# Patient Record
Sex: Female | Born: 1955
Health system: Southern US, Community
[De-identification: ages and names within clinical notes are randomized; demographics above are authoritative.]

## PROBLEM LIST (undated history)

## (undated) DIAGNOSIS — K219 Gastro-esophageal reflux disease without esophagitis: Secondary | ICD-10-CM

## (undated) DIAGNOSIS — T7840XA Allergy, unspecified, initial encounter: Secondary | ICD-10-CM

## (undated) DIAGNOSIS — J45909 Unspecified asthma, uncomplicated: Secondary | ICD-10-CM

## (undated) HISTORY — DX: Allergy, unspecified, initial encounter: T78.40XA

## (undated) HISTORY — DX: Gastro-esophageal reflux disease without esophagitis: K21.9

## (undated) HISTORY — DX: Unspecified asthma, uncomplicated: J45.909

---

## 2012-12-16 DIAGNOSIS — B372 Candidiasis of skin and nail: Secondary | ICD-10-CM | POA: Insufficient documentation

## 2012-12-16 DIAGNOSIS — Z01419 Encounter for gynecological examination (general) (routine) without abnormal findings: Secondary | ICD-10-CM | POA: Insufficient documentation

## 2012-12-16 DIAGNOSIS — G43909 Migraine, unspecified, not intractable, without status migrainosus: Secondary | ICD-10-CM | POA: Insufficient documentation

## 2012-12-16 DIAGNOSIS — I7300 Raynaud's syndrome without gangrene: Secondary | ICD-10-CM | POA: Insufficient documentation

## 2012-12-16 DIAGNOSIS — IMO0001 Reserved for inherently not codable concepts without codable children: Secondary | ICD-10-CM | POA: Insufficient documentation

## 2012-12-16 DIAGNOSIS — K219 Gastro-esophageal reflux disease without esophagitis: Secondary | ICD-10-CM | POA: Insufficient documentation

## 2014-12-09 DIAGNOSIS — Z9189 Other specified personal risk factors, not elsewhere classified: Secondary | ICD-10-CM | POA: Insufficient documentation

## 2014-12-20 ENCOUNTER — Other Ambulatory Visit: Payer: Self-pay | Admitting: Nurse Practitioner

## 2014-12-20 DIAGNOSIS — R928 Other abnormal and inconclusive findings on diagnostic imaging of breast: Secondary | ICD-10-CM

## 2014-12-21 ENCOUNTER — Other Ambulatory Visit: Payer: Self-pay

## 2014-12-21 ENCOUNTER — Other Ambulatory Visit: Payer: Self-pay | Admitting: Nurse Practitioner

## 2014-12-21 DIAGNOSIS — R928 Other abnormal and inconclusive findings on diagnostic imaging of breast: Secondary | ICD-10-CM

## 2014-12-26 ENCOUNTER — Ambulatory Visit
Admission: RE | Admit: 2014-12-26 | Discharge: 2014-12-26 | Disposition: A | Discharge status: Home / Self Care | Payer: BLUE CROSS/BLUE SHIELD | Source: Ambulatory Visit | Attending: Nurse Practitioner | Admitting: Nurse Practitioner

## 2014-12-26 DIAGNOSIS — R928 Other abnormal and inconclusive findings on diagnostic imaging of breast: Secondary | ICD-10-CM

## 2016-03-01 ENCOUNTER — Other Ambulatory Visit: Payer: Self-pay | Admitting: *Deleted

## 2016-03-01 DIAGNOSIS — Z1231 Encounter for screening mammogram for malignant neoplasm of breast: Secondary | ICD-10-CM

## 2016-03-15 ENCOUNTER — Ambulatory Visit
Admission: RE | Admit: 2016-03-15 | Discharge: 2016-03-15 | Disposition: A | Discharge status: Home / Self Care | Payer: BLUE CROSS/BLUE SHIELD | Source: Ambulatory Visit | Attending: *Deleted | Admitting: *Deleted

## 2016-03-15 DIAGNOSIS — Z1231 Encounter for screening mammogram for malignant neoplasm of breast: Secondary | ICD-10-CM

## 2017-12-19 ENCOUNTER — Other Ambulatory Visit: Payer: Self-pay | Admitting: *Deleted

## 2017-12-19 DIAGNOSIS — Z1231 Encounter for screening mammogram for malignant neoplasm of breast: Secondary | ICD-10-CM

## 2018-01-09 ENCOUNTER — Ambulatory Visit
Admission: RE | Admit: 2018-01-09 | Discharge: 2018-01-09 | Disposition: A | Discharge status: Home / Self Care | Payer: 59 | Source: Ambulatory Visit | Attending: *Deleted | Admitting: *Deleted

## 2018-01-09 DIAGNOSIS — Z1231 Encounter for screening mammogram for malignant neoplasm of breast: Secondary | ICD-10-CM

## 2019-03-18 ENCOUNTER — Other Ambulatory Visit: Payer: Self-pay | Admitting: *Deleted

## 2019-03-18 ENCOUNTER — Ambulatory Visit
Admission: RE | Admit: 2019-03-18 | Discharge: 2019-03-18 | Disposition: A | Discharge status: Home / Self Care | Payer: 59 | Source: Ambulatory Visit | Attending: *Deleted | Admitting: *Deleted

## 2019-03-18 ENCOUNTER — Other Ambulatory Visit: Payer: Self-pay

## 2019-03-18 DIAGNOSIS — Z1231 Encounter for screening mammogram for malignant neoplasm of breast: Secondary | ICD-10-CM

## 2019-08-28 IMAGING — MG DIGITAL SCREENING BILATERAL MAMMOGRAM WITH TOMO AND CAD
8 series · 8 of 24 positions shown · non-contrast
Comparison: Previous exam(s).

CLINICAL DATA: Screening.

EXAM:
DIGITAL SCREENING BILATERAL MAMMOGRAM WITH TOMO AND CAD

[R CC synth-2D]
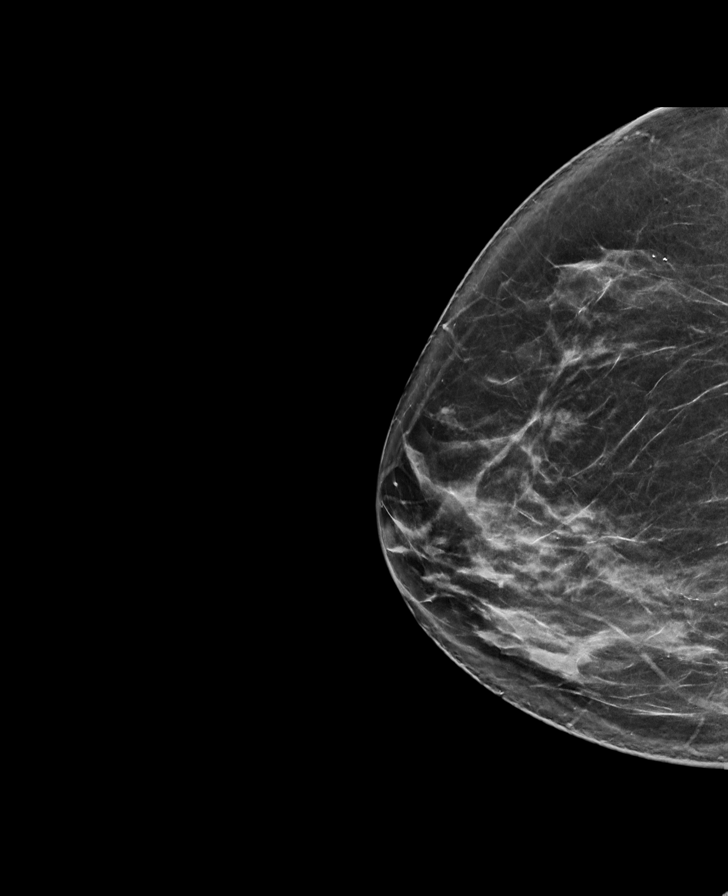

[L MLO synth-2D]
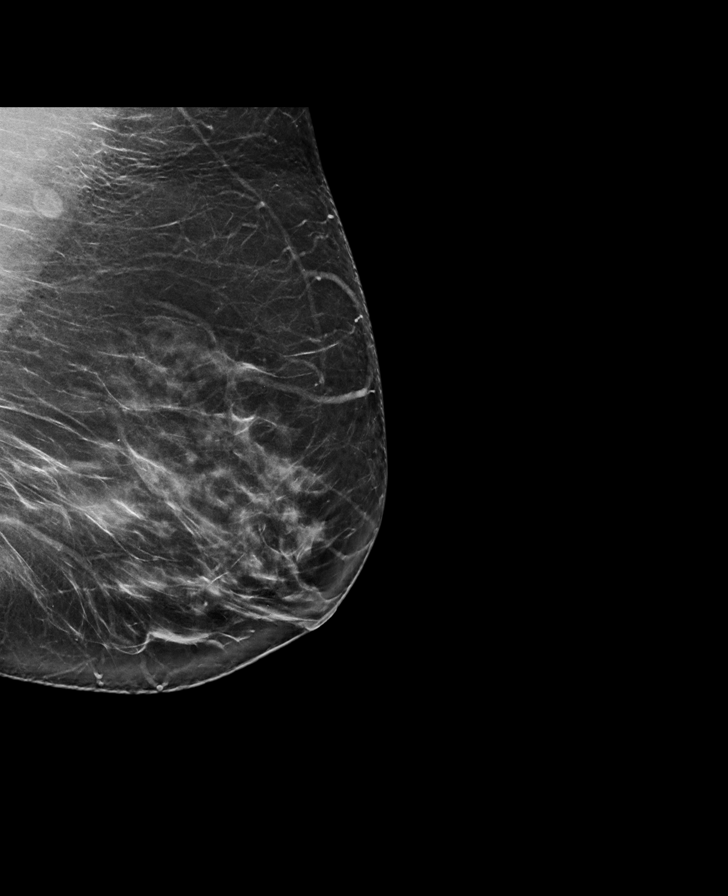

[L CC synth-2D]
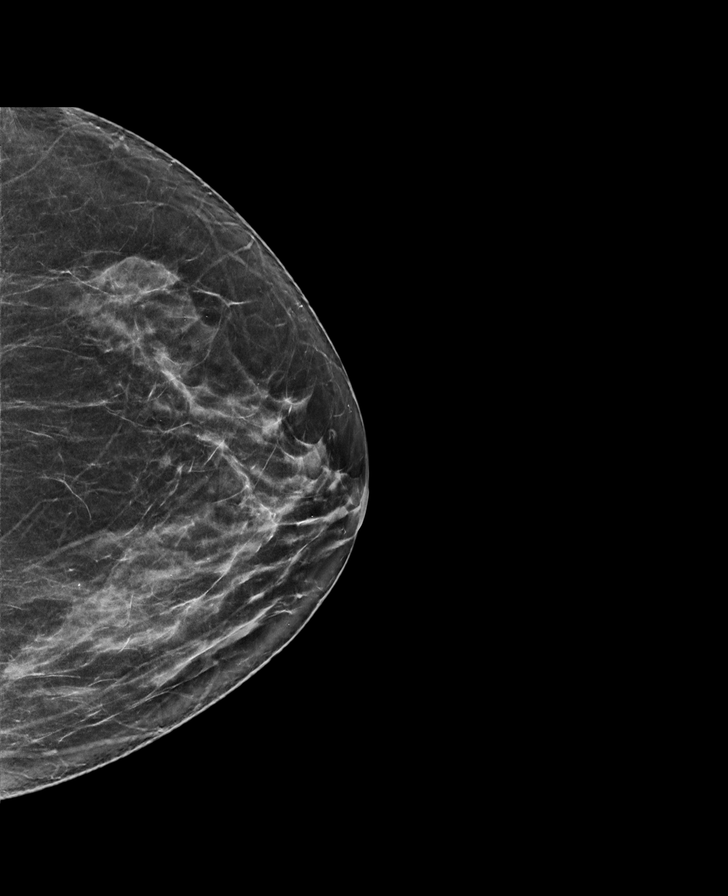

[R MLO synth-2D]
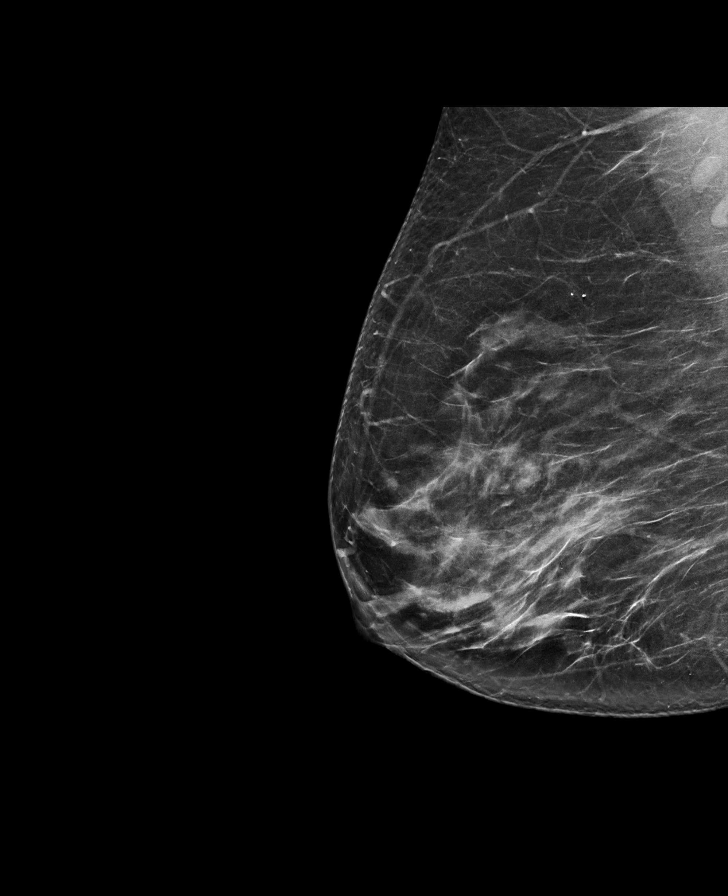

[L CC tomo · tomo slice 35/69.0]
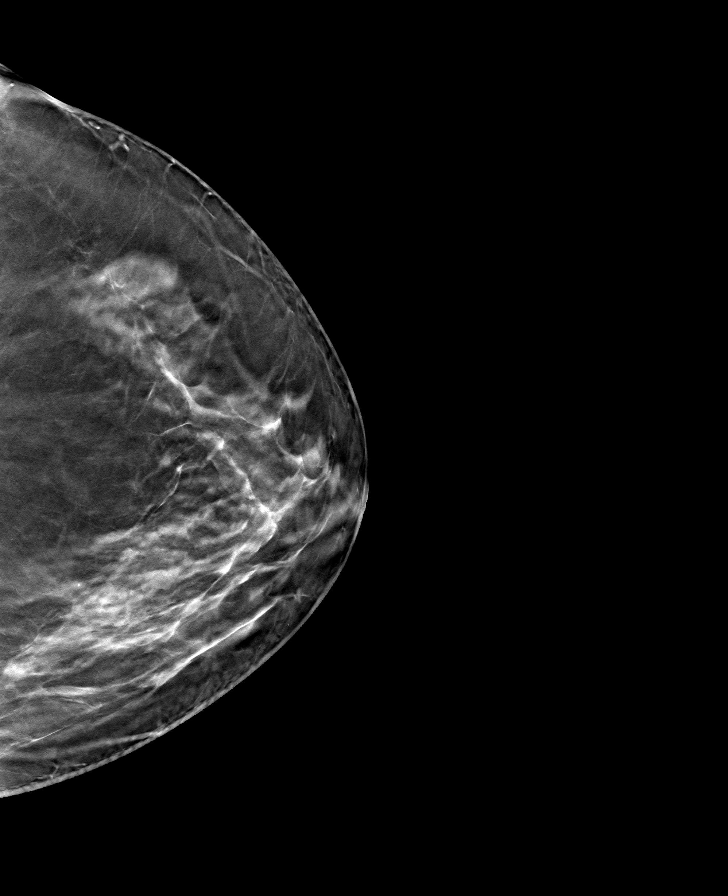

[R MLO tomo · tomo slice 39/77.0]
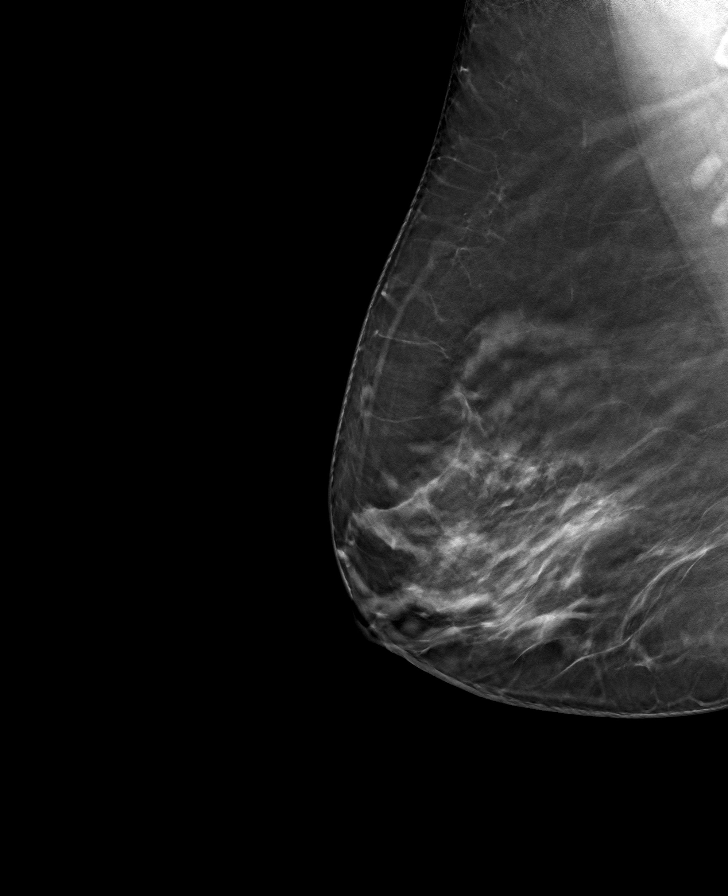

[L MLO tomo · tomo slice 41/80.0]
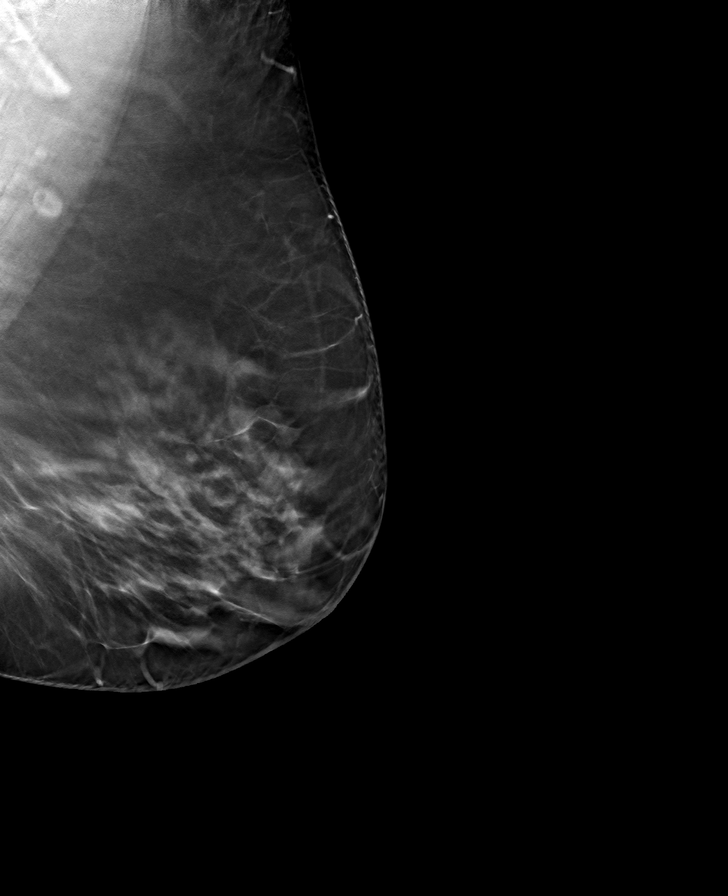

[R CC tomo · tomo slice 37/74.0]
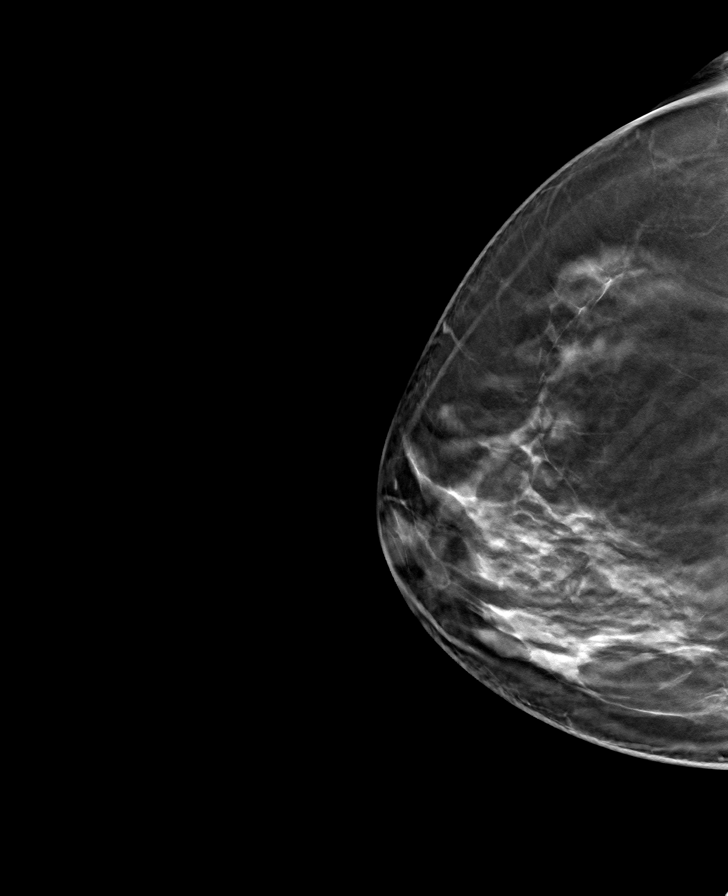

[8 of 24 positions shown; findings below may reference images not displayed]

ACR Breast Density Category c: The breast tissue is heterogeneously
dense, which may obscure small masses.
FINDINGS: There are no findings suspicious for malignancy. Images were
processed with CAD.
IMPRESSION: No mammographic evidence of malignancy. A result letter of this
screening mammogram will be mailed directly to the patient.

RECOMMENDATION:
Screening mammogram in one year. (Code:[id number])

BI-RADS CATEGORY  1: Negative.

## 2020-05-25 ENCOUNTER — Other Ambulatory Visit: Payer: Self-pay

## 2020-05-25 ENCOUNTER — Other Ambulatory Visit: Payer: Self-pay | Admitting: *Deleted

## 2020-05-25 ENCOUNTER — Ambulatory Visit
Admission: RE | Admit: 2020-05-25 | Discharge: 2020-05-25 | Disposition: A | Discharge status: Home / Self Care | Payer: 59 | Source: Ambulatory Visit | Attending: *Deleted | Admitting: *Deleted

## 2020-05-25 DIAGNOSIS — Z1231 Encounter for screening mammogram for malignant neoplasm of breast: Secondary | ICD-10-CM

## 2020-07-02 LAB — CBC AND DIFFERENTIAL
HCT: 46 (ref 36–46)
Hemoglobin: 14.6 (ref 12.0–16.0)
Platelets: 279 (ref 150–399)
WBC: 10.6

## 2020-07-02 LAB — BASIC METABOLIC PANEL
BUN: 13 (ref 4–21)
CO2: 25 — AB (ref 13–22)
Chloride: 102 (ref 99–108)
Creatinine: 0.9 (ref <=1.1)
Glucose: 290
Potassium: 4.6 (ref 3.4–5.3)
Sodium: 139 (ref 137–147)

## 2020-07-02 LAB — HEPATIC FUNCTION PANEL
ALT: 25 (ref 7–35)
AST: 34 (ref 13–35)
Alkaline Phosphatase: 54 (ref 25–125)
Bilirubin, Total: 0.4

## 2020-07-02 LAB — COMPREHENSIVE METABOLIC PANEL
Albumin: 4.5 (ref 3.5–5.0)
Calcium: 8.7 (ref 8.7–10.7)

## 2020-07-02 LAB — POCT INR: INR: 1 (ref <=1.1)

## 2020-07-02 LAB — HEMOGLOBIN A1C: Hemoglobin A1C: 5.7

## 2020-07-03 DIAGNOSIS — J4522 Mild intermittent asthma with status asthmaticus: Secondary | ICD-10-CM | POA: Insufficient documentation

## 2020-07-05 ENCOUNTER — Ambulatory Visit: Payer: 59 | Admitting: Family Medicine

## 2020-07-13 ENCOUNTER — Ambulatory Visit: Payer: 59 | Admitting: Family Medicine

## 2020-07-13 ENCOUNTER — Encounter: Payer: Self-pay | Admitting: Family Medicine

## 2020-07-13 ENCOUNTER — Other Ambulatory Visit: Payer: Self-pay

## 2020-07-13 VITALS — BP 128/72 | HR 84 | Temp 97.2°F | Ht 61.0 in | Wt 228.0 lb

## 2020-07-13 DIAGNOSIS — R0603 Acute respiratory distress: Secondary | ICD-10-CM

## 2020-07-13 DIAGNOSIS — K219 Gastro-esophageal reflux disease without esophagitis: Secondary | ICD-10-CM

## 2020-07-13 MED ORDER — ALBUTEROL SULFATE HFA 108 (90 BASE) MCG/ACT IN AERS: 2.0000 | INHALATION_SPRAY | Freq: Four times a day (QID) | RESPIRATORY_TRACT | 5 refills | Status: AC | PRN

## 2020-07-13 NOTE — Progress Notes (Signed)
New Patient Office Visit  Subjective:  Patient ID: Kiara Bailey, female    DOB: 11/27/1955  Age: 64 y.o. MRN: [id number]  IO:[id number] care  HPI Kiara Bailey presents to establish care-Family Physician-Hospice care locally  Pt [place]  [place]. Pt states it was cold and rainy. This house where they were staying had a cat that had been there at some point that caused her to have-itchy eyes. Pt stated she had been using her inhaler several times a day due to wheezing caused by the weather change. Pt stated she did not use steroid inhalers as she felt they caused weight gain. Pt had not received a steroid injection recently nor had she been seen in an ER/UC. Pt states she used albuterol nebulizer and MDI for intermittent asthma. When pt came back from the beach a mess had occurred in her house. She requested that the  wet/dry vac be used to clean up  the mess. Unfortunately the vac had been outside and the filter had not been recently changed. The pt immediately noted the smell of stink bugs when the unit was turned on and she started to feel tightness in her chest. The pt states she rapidly could not move air into her lungs. Pt used her albuterol MDI-15 times with no improvement-911 was  called for immediate evaluation. Upon arrival by EMS at her home EMS personal  reported she was cyanotic and oxygen sat 40%. Pt remembers her family saying they(EMS) are here and she remembers nothing else until waking up in ER. EMS inserted NPA and bagged the patient improving her oxygen level to 98%.  Pt was given 0.5mg  epi and 125 Solumedrol on the way to the ER. In the ER pt was given  Magnesium 2grams, 1 L NS. Pt completed an ecg and labwork with the first hour of resuscitation marked improvement noted in her airway. No hypotension, hives, throat or tongue swelling  Pt sat at 89% on 6L [place]. Pt was placed on 15L [place] and stats improved to 93%. BP  182/134 on arrival with a HR123-improved to BP 111/74, pulse 85. WBC 10.9/H/H 14.6/45/8 D DIMER QUANT 40 NA 139 POT 4.6, GLC 290, HGBa1c 5.7%  Toponin .034 COVID negative, CXR heart size nl, lungs clear CTA-no defects, mild cardiomegaly, no pericardial effusion or thickening.   PMH-asthma/GERD/migraines/pneumonia yearly over the past several years/Raynaud's disease/shingles 2010 PSH-GYN cryo, T and A 1963  Pt was concerned at one time she had fibromyalgia-drinks reliv twice a week and feels better physicially  Divorced-lives with significant other for 10 years-2 children, 2 grandchildren Works as a Development worker, community for [employer] tob use No regular alcohol use Social History   Socioeconomic History  . Marital status: Married    Spouse name: Not on file  . Number of children: Not on file  . Years of education: Not on file  . Highest education level: Not on file  Occupational History  . Not on file  Tobacco Use  . Smoking status: Not on file  Substance and Sexual Activity  . Alcohol use: Not on file  . Drug use: Not on file  . Sexual activity: Not on file  Other Topics Concern  . Not on file  Social History Narrative  . Not on file   Social Determinants of Health   Financial Resource Strain:   . Difficulty of Paying Living Expenses: Not on file  Food Insecurity:   . Worried  About Running Out of Food in the Last Year: Not on file  . Ran Out of Food in the Last Year: Not on file  Transportation Needs:   . Lack of Transportation (Medical): Not on file  . Lack of Transportation (Non-Medical): Not on file  Physical Activity:   . Days of Exercise per Week: Not on file  . Minutes of Exercise per Session: Not on file  Stress:   . Feeling of Stress : Not on file  Social Connections:   . Frequency of Communication with Friends and Family: Not on file  . Frequency of Social Gatherings with Friends and Family: Not on file  . Attends Religious Services: Not on file  . Active Member of  Clubs or Organizations: Not on file  . Attends Banker Meetings: Not on file  . Marital Status: Not on file  Intimate Partner Violence:   . Fear of Current or Ex-Partner: Not on file  . Emotionally Abused: Not on file  . Physically Abused: Not on file  . Sexually Abused: Not on file    ROS Review of Systems  Constitutional: Positive for fatigue. Negative for fever.  HENT: Positive for postnasal drip. Negative for ear pain, sinus pressure, sinus pain, sore throat, tinnitus, trouble swallowing and voice change.   Eyes:       Glasses  Respiratory:       Chest tightness-resolved SOB-resolved Cough-resolved  Cardiovascular: Positive for leg swelling.  Gastrointestinal:       GERD  Genitourinary:       Stress incontinence-cough/laugh  Musculoskeletal: Negative.   Skin: Negative.   Allergic/Immunologic: Positive for environmental allergies.  Neurological: Negative for dizziness, speech difficulty, weakness and headaches.  Hematological: Negative.   Psychiatric/Behavioral:       Depression -living situation, pt has a daughter who she assists in caring for her child. 7yo little boy(grandchild)-keeps him at night and takes him to school in the morning. Daughter is not working as she also has a Personnel officer. Pt anxious about recent  event.    Objective:   Today's Vitals: BP 128/72 (BP Location: Left Arm, Patient Position: Sitting, Cuff Size: Large)   Pulse 84   Temp (!) 97.2 F (36.2 C) (Temporal)   Ht 5\' 1"  (1.549 m)   Wt 228 lb (103.4 kg)   SpO2 98%   BMI 43.08 kg/m   Physical Exam Constitutional:      Appearance: Normal appearance.  HENT:     Head: Normocephalic and atraumatic.     Nose: Nose normal.     Mouth/Throat:     Mouth: Mucous membranes are moist.  Eyes:     Conjunctiva/sclera: Conjunctivae normal.  Cardiovascular:     Rate and Rhythm: Normal rate and regular rhythm.     Pulses: Normal pulses.     Heart sounds: Normal heart sounds.  Pulmonary:      Effort: Pulmonary effort is normal.     Breath sounds: Normal breath sounds.  Musculoskeletal:     Cervical back: Normal range of motion and neck supple.     Right lower leg: Edema present.     Left lower leg: Edema present.  Neurological:     General: No focal deficit present.     Mental Status: She is alert and oriented to person, place, and time.  Psychiatric:        Mood and Affect: Mood normal.        Behavior: Behavior normal.     Assessment &  Plan:  1. Respiratory distress Allergy testing-d/w pt importance of identifying triggers. No prior admission for respiratory distress.  Epi Albuterol solumedrol 2. Gastroesophageal reflux disease, unspecified whether esophagitis present No current concerns-Nexium  Outpatient Encounter Medications as of 07/13/2020  Medication Sig  . albuterol (PROVENTIL) (2.5 MG/3ML) 0.083% nebulizer solution Take by nebulization.  Marland Kitchen albuterol (VENTOLIN HFA) 108 (90 Base) MCG/ACT inhaler Inhale into the lungs.  Marland Kitchen aspirin-acetaminophen-caffeine (EXCEDRIN MIGRAINE) 250-250-65 MG tablet Take by mouth.  . EPINEPHrine 0.3 mg/0.3 mL IJ SOAJ injection Inject once if have throat swelling, severe difficulty breathing. If you use an Epi pen you must call EMS or go to the hospital immediately.  . [DISCONTINUED] albuterol (PROVENTIL) (2.5 MG/3ML) 0.083% nebulizer solution Inhale into the lungs.   No facility-administered encounter medications on file as of 07/13/2020.  45 min spent with patient-obtaining history, reviewing records from ER and hospitalization. Discussion about assessment and plan-pt agreed to allergy assessment  Follow-up: Allergist to identify trigger-life threatening reaction Dorette Grate, MD

## 2020-07-13 NOTE — Patient Instructions (Signed)
Asthma, Adult  Asthma is a long-term (chronic) condition in which the airways get tight and narrow. The airways are the breathing passages that lead from the nose and mouth down into the lungs. A person with asthma will have times when symptoms get worse. These are called asthma attacks. They can cause coughing, whistling sounds when you breathe (wheezing), shortness of breath, and chest pain. They can make it hard to breathe. There is no cure for asthma, but medicines and lifestyle changes can help control it. There are many things that can bring on an asthma attack or make asthma symptoms worse (triggers). Common triggers include:  Mold.  Dust.  Cigarette smoke.  Cockroaches.  Things that can cause allergy symptoms (allergens). These include animal skin flakes (dander) and pollen from trees or grass.  Things that pollute the air. These may include household cleaners, wood smoke, smog, or chemical odors.  Cold air, weather changes, and wind.  Crying or laughing hard.  Stress.  Certain medicines or drugs.  Certain foods such as dried fruit, potato chips, and grape juice.  Infections, such as a cold or the flu.  Certain medical conditions or diseases.  Exercise or tiring activities. Asthma may be treated with medicines and by staying away from the things that cause asthma attacks. Types of medicines may include:  Controller medicines. These help prevent asthma symptoms. They are usually taken every day.  Fast-acting reliever or rescue medicines. These quickly relieve asthma symptoms. They are used as needed and provide short-term relief.  Allergy medicines if your attacks are brought on by allergens.  Medicines to help control the body's defense (immune) system. Follow these instructions at home: Avoiding triggers in your home  Change your heating and air conditioning filter often.  Limit your use of fireplaces and wood stoves.  Get rid of pests (such as roaches and  mice) and their droppings.  Throw away plants if you see mold on them.  Clean your floors. Dust regularly. Use cleaning products that do not smell.  Have someone vacuum when you are not home. Use a vacuum cleaner with a HEPA filter if possible.  Replace carpet with wood, tile, or vinyl flooring. Carpet can trap animal skin flakes and dust.  Use allergy-proof pillows, mattress covers, and box spring covers.  Wash bed sheets and blankets every week in hot water. Dry them in a dryer.  Keep your bedroom free of any triggers.  Avoid pets and keep windows closed when things that cause allergy symptoms are in the air.  Use blankets that are made of polyester or cotton.  Clean bathrooms and kitchens with bleach. If possible, have someone repaint the walls in these rooms with mold-resistant paint. Keep out of the rooms that are being cleaned and painted.  Wash your hands often with soap and water. If soap and water are not available, use hand sanitizer.  Do not allow anyone to smoke in your home. General instructions  Take over-the-counter and prescription medicines only as told by your doctor. ? Talk with your doctor if you have questions about how or when to take your medicines. ? Make note if you need to use your medicines more often than usual.  Do not use any products that contain nicotine or tobacco, such as cigarettes and e-cigarettes. If you need help quitting, ask your doctor.  Stay away from secondhand smoke.  Avoid doing things outdoors when allergen counts are high and when air quality is low.  Wear a ski mask   when doing outdoor activities in the winter. The mask should cover your nose and mouth. Exercise indoors on cold days if you can.  Warm up before you exercise. Take time to cool down after exercise.  Use a peak flow meter as told by your doctor. A peak flow meter is a tool that measures how well the lungs are working.  Keep track of the peak flow meter's readings.  Write them down.  Follow your asthma action plan. This is a written plan for taking care of your asthma and treating your attacks.  Make sure you get all the shots (vaccines) that your doctor recommends. Ask your doctor about a flu shot and a pneumonia shot.  Keep all follow-up visits as told by your doctor. This is important. Contact a doctor if:  You have wheezing, shortness of breath, or a cough even while taking medicine to prevent attacks.  The mucus you cough up (sputum) is thicker than usual.  The mucus you cough up changes from clear or white to yellow, green, gray, or bloody.  You have problems from the medicine you are taking, such as: ? A rash. ? Itching. ? Swelling. ? Trouble breathing.  You need reliever medicines more than 2-3 times a week.  Your peak flow reading is still at 50-79% of your personal best after following the action plan for 1 hour.  You have a fever. Get help right away if:  You seem to be worse and are not responding to medicine during an asthma attack.  You are short of breath even at rest.  You get short of breath when doing very little activity.  You have trouble eating, drinking, or talking.  You have chest pain or tightness.  You have a fast heartbeat.  Your lips or fingernails start to turn blue.  You are light-headed or dizzy, or you faint.  Your peak flow is less than 50% of your personal best.  You feel too tired to breathe normally. Summary  Asthma is a long-term (chronic) condition in which the airways get tight and narrow. An asthma attack can make it hard to breathe.  Asthma cannot be cured, but medicines and lifestyle changes can help control it.  Make sure you understand how to avoid triggers and how and when to use your medicines. This information is not intended to replace advice given to you by your health care provider. Make sure you discuss any questions you have with your health care provider. Document Revised:  10/15/2018 Document Reviewed: 09/16/2016 Elsevier Patient Education  2020 [employer] Inc.  

## 2020-07-24 ENCOUNTER — Encounter: Payer: Self-pay | Admitting: Family Medicine

## 2020-07-24 NOTE — Progress Notes (Signed)
TO WHOM IT MAY CONCERN:  Dr. Dan Humphreys was seen at [facility] for a hospital follow up from an anaphylactic reaction requiring EMS transport for Emergency evaluation.  The patient was hospitalized overnight for additional observation.  The patient has an allergy appointment scheduled for August 31 2020 to further assess the allergens that triggered the event. Please allow the patient to continue telecommuting until the allergy evaluation has been completed.  Thank you for your attention in this matter.   Sincerely, Dorette Grate, MD

## 2020-08-31 ENCOUNTER — Ambulatory Visit: Payer: 59 | Admitting: Allergy and Immunology

## 2020-11-24 ENCOUNTER — Other Ambulatory Visit (HOSPITAL_COMMUNITY): Payer: Self-pay | Admitting: *Deleted

## 2020-12-04 ENCOUNTER — Other Ambulatory Visit: Payer: Self-pay

## 2020-12-04 ENCOUNTER — Ambulatory Visit (HOSPITAL_BASED_OUTPATIENT_CLINIC_OR_DEPARTMENT_OTHER)
Admission: RE | Admit: 2020-12-04 | Discharge: 2020-12-04 | Disposition: A | Discharge status: Home / Self Care | Payer: 59 | Source: Ambulatory Visit | Attending: Cardiology | Admitting: Cardiology

## 2020-12-16 ENCOUNTER — Other Ambulatory Visit: Payer: Self-pay | Admitting: Family Medicine

## 2022-07-23 IMAGING — CT CT CARDIAC CORONARY ARTERY CALCIUM SCORE
2 series · 14 of 20 positions shown, 16 images · non-contrast
Comparison: None.
COMPARISON: None.

Addendum:
EXAM:
OVER-READ INTERPRETATION  CT CHEST

The following report is an over-read performed by radiologist Dr.
Aurea Tho [REDACTED] on 12/04/2020. This
over-read does not include interpretation of cardiac or coronary
anatomy or pathology. The coronary calcium score interpretation by
the cardiologist is attached.
CLINICAL DATA: Cardiovascular Disease Risk stratification
Coronary Calcium Score
TECHNIQUE: A gated, non-contrast computed tomography scan of the heart was
performed using 3mm slice thickness. Axial images were analyzed on a
dedicated workstation. Calcium scoring of the coronary arteries was
performed using the Agatston method.

[Series 2: casc 3.0 i36f 2 (id) · axial · 0.40mm/px · z∈[-232,-127]mm · 8 of 47 slices shown, 10 images]
[im 6/47  vessel]
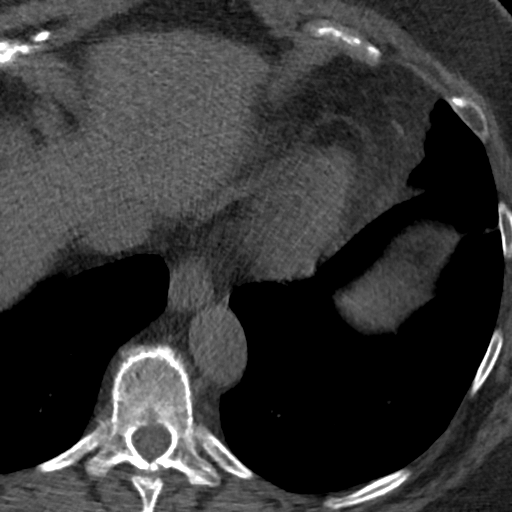
[im 6/47  lung]
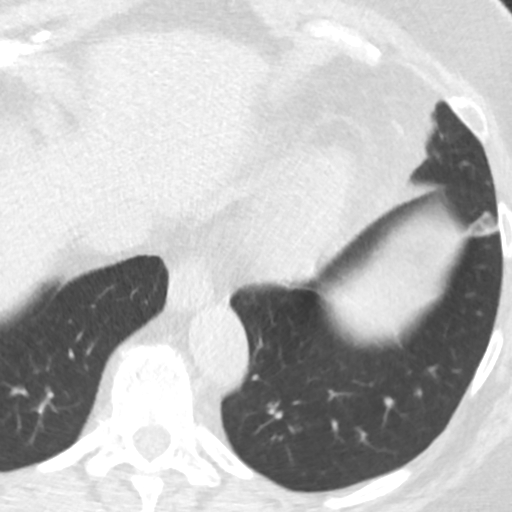
[im 11/47  vessel]
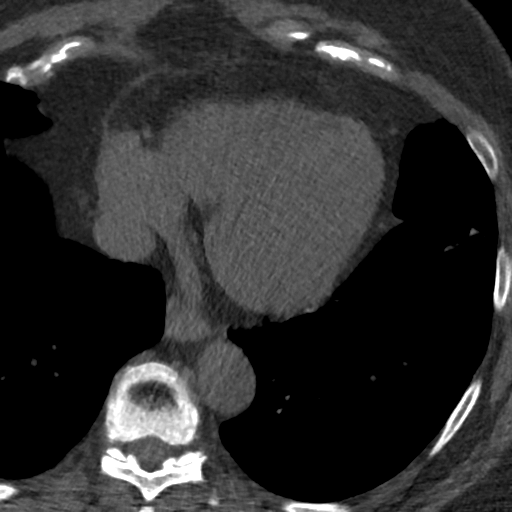
[im 16/47  vessel]
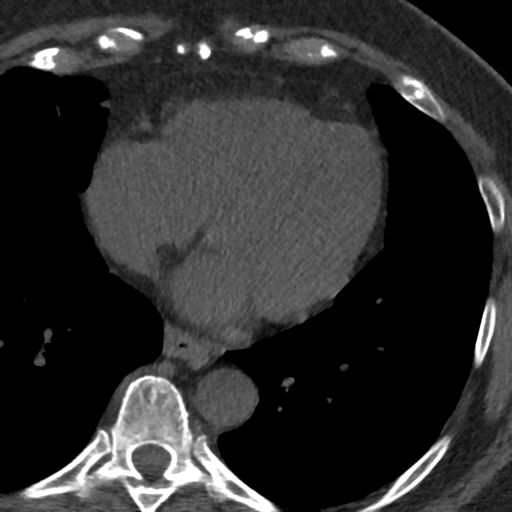
[im 21/47  vessel]
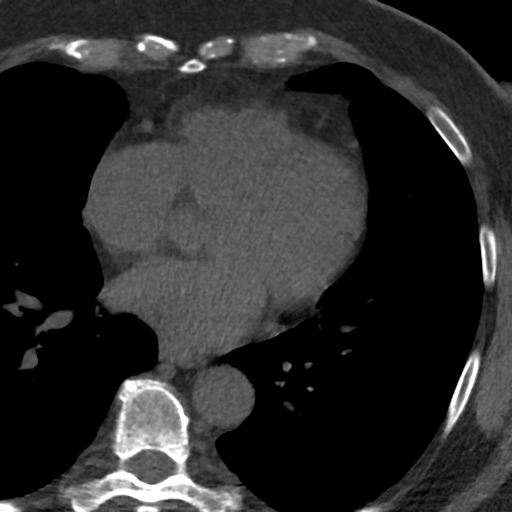
[im 26/47  vessel]
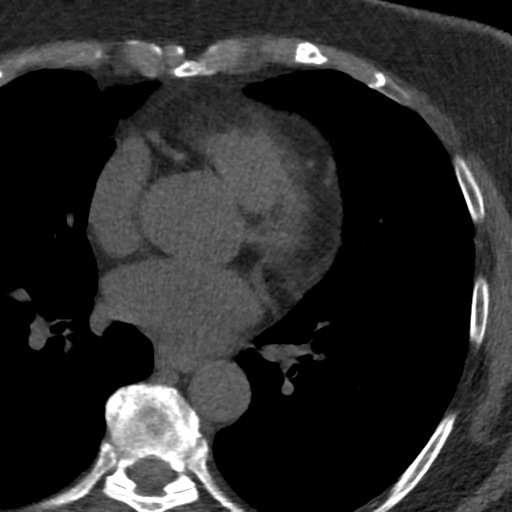
[im 26/47  lung]
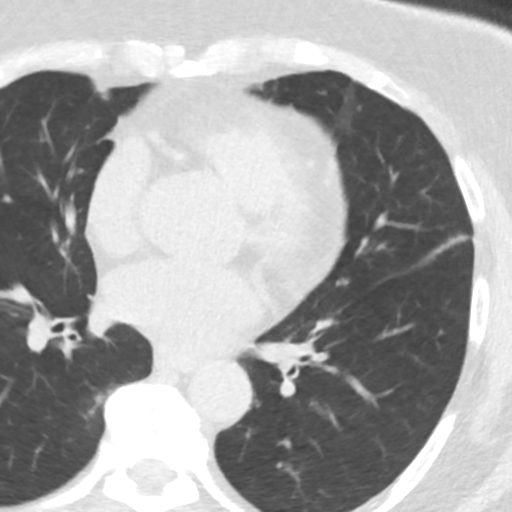
[im 31/47  vessel]
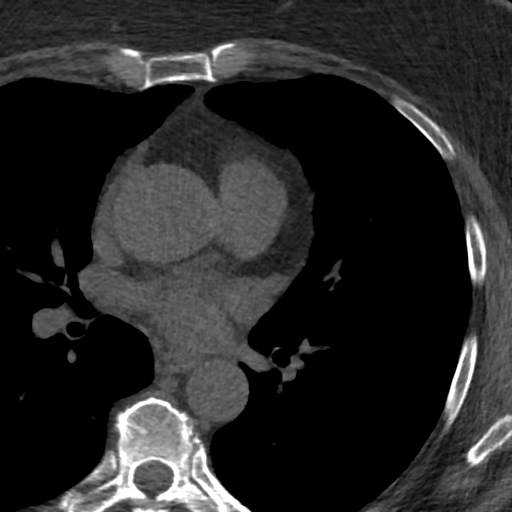
[im 36/47  vessel]
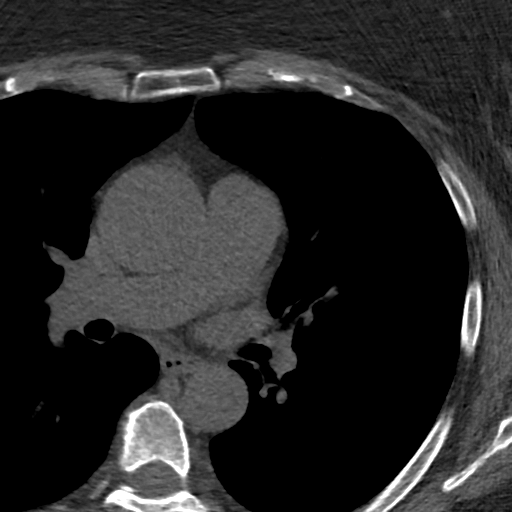
[im 41/47  vessel]
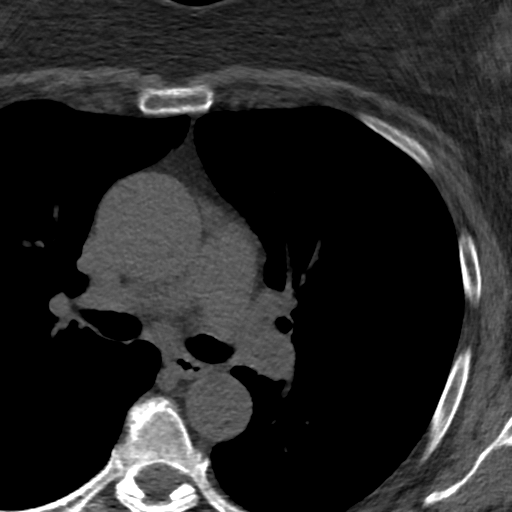

[Series 4: lung st · axial · 0.73mm/px · z∈[-232,-157]mm · 6 of 47 slices shown]
[im 6/47  lung]
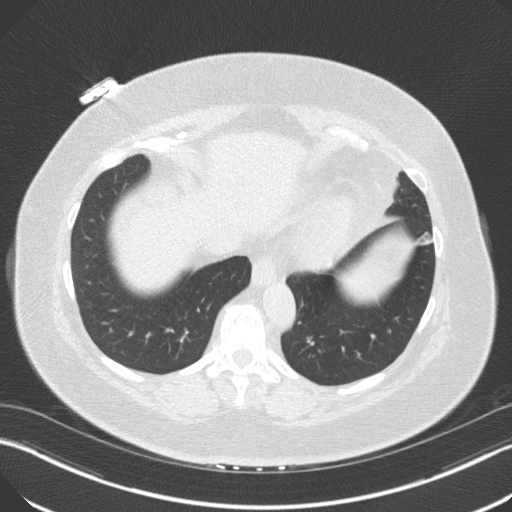
[im 11/47  lung]
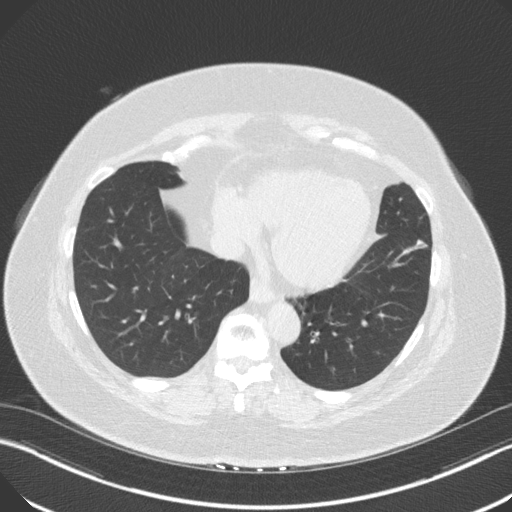
[im 16/47  lung]
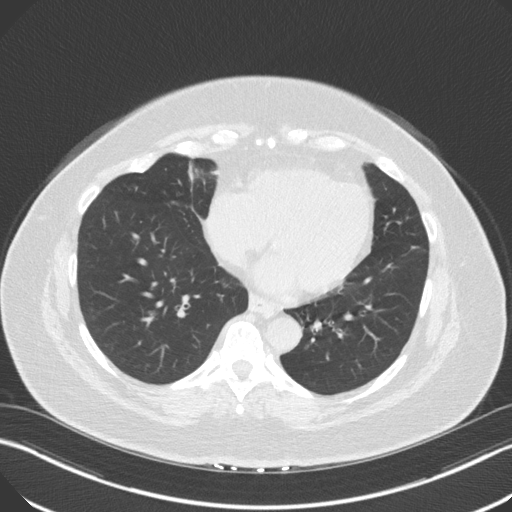
[im 21/47  lung]
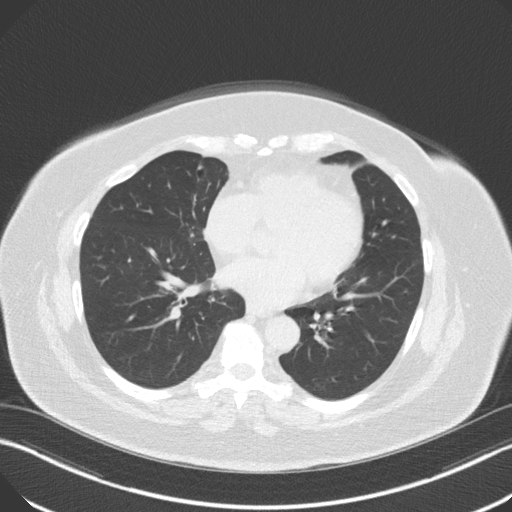
[im 26/47  lung]
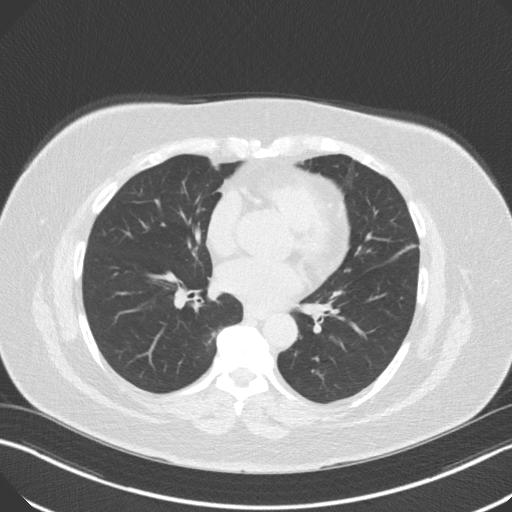
[im 31/47  lung]
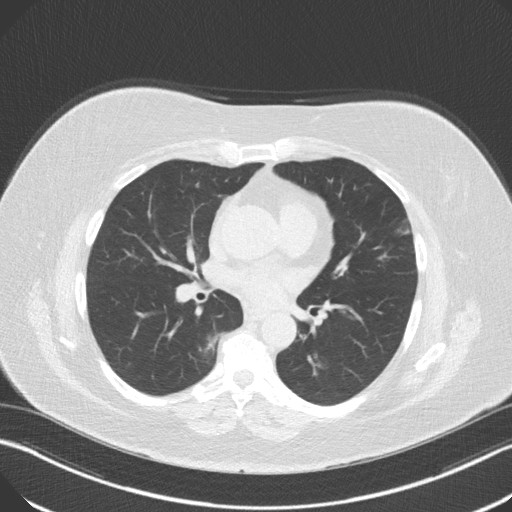

[14 of 20 positions shown; findings below may reference images not displayed]

FINDINGS: Aneurysmal dilatation of the ascending thoracic aorta (4.5 cm in
diameter). Mild scarring in the lung bases bilaterally. Within the
visualized portions of the thorax there are no suspicious appearing
pulmonary nodules or masses, there is no acute consolidative
airspace disease, no pleural effusions, no pneumothorax and no
lymphadenopathy. Visualized portions of the upper abdomen are
unremarkable. There are no aggressive appearing lytic or blastic
lesions noted in the visualized portions of the skeleton.
IMPRESSION: 1. Mild aneurysmal dilatation of the ascending thoracic aorta (4.5
cm in diameter). Ascending thoracic aortic aneurysm. Recommend
semi-annual imaging followup by CTA or MRA and referral to
cardiothoracic surgery if not already obtained. This recommendation
follows 7383 ACCF/AHA/AATS/ACR/ASA/SCA/EX/DHARNI/JUMPER/AZALAI Guidelines
for the Diagnosis and Management of Patients With Thoracic Aortic
Disease. Circulation. 7383; 121: E266-e369. Aortic aneurysm NOS
(AYOJI-YES.3).
FINDINGS: Coronary Calcium Score:

Left main: 0

Left anterior descending artery: 0

Left circumflex artery: 0

Right coronary artery: 0

Total: 0

Percentile: NA

Pericardium: Normal.

Non-cardiac: See separate report from [REDACTED].
IMPRESSION: Coronary calcium score of 0.



If CAC=0, it is reasonable to withhold statin therapy and reassess
in 5 to 10 years, as long as higher risk conditions are absent
(diabetes mellitus, family history of premature CHD in first degree
relatives (males <55 years; females <65 years), cigarette smoking,
or LDL >=190 mg/dL).

If CAC is 1 to 99, it is reasonable to initiate statin therapy for
patients >=55 years of age.

If CAC is >=100 or >=75th percentile, it is reasonable to initiate
statin therapy at any age.

Cardiology referral should be considered for patients with CAC
scores >=400 or >=75th percentile.

*8428 AHA/ACC/AACVPR/AAPA/ABC/TREPEE/ULRICH/SIMPSON/Jens/MOATSHE/KOTOV/RAULLU
Guideline on the Management of Blood Cholesterol: A Report of the
American College of Cardiology/American Heart Association Task Force
on Clinical Practice Guidelines. J Am Coll Cardiol.
0888;73(24):5295-5957.

*** End of Addendum ***
EXAM:
OVER-READ INTERPRETATION  CT CHEST

The following report is an over-read performed by radiologist Dr.
Aurea Tho [REDACTED] on 12/04/2020. This
over-read does not include interpretation of cardiac or coronary
anatomy or pathology. The coronary calcium score interpretation by
the cardiologist is attached.
FINDINGS: Aneurysmal dilatation of the ascending thoracic aorta (4.5 cm in
diameter). Mild scarring in the lung bases bilaterally. Within the
visualized portions of the thorax there are no suspicious appearing
pulmonary nodules or masses, there is no acute consolidative
airspace disease, no pleural effusions, no pneumothorax and no
lymphadenopathy. Visualized portions of the upper abdomen are
unremarkable. There are no aggressive appearing lytic or blastic
lesions noted in the visualized portions of the skeleton.
IMPRESSION: 1. Mild aneurysmal dilatation of the ascending thoracic aorta (4.5
cm in diameter). Ascending thoracic aortic aneurysm. Recommend
semi-annual imaging followup by CTA or MRA and referral to
cardiothoracic surgery if not already obtained. This recommendation
follows 7383 ACCF/AHA/AATS/ACR/ASA/SCA/EX/DHARNI/JUMPER/AZALAI Guidelines
for the Diagnosis and Management of Patients With Thoracic Aortic
Disease. Circulation. 7383; 121: E266-e369. Aortic aneurysm NOS
(AYOJI-YES.3).
